# Patient Record
Sex: Male | Born: 1960 | Race: White | Hispanic: No | Marital: Married
Health system: Southern US, Community
[De-identification: ages and names within clinical notes are randomized; demographics above are authoritative.]

---

## 1999-01-25 ENCOUNTER — Ambulatory Visit (HOSPITAL_COMMUNITY): Admission: RE | Admit: 1999-01-25 | Discharge: 1999-01-25 | Payer: Self-pay | Admitting: Gastroenterology

## 2000-01-31 ENCOUNTER — Emergency Department (HOSPITAL_COMMUNITY): Admission: EM | Admit: 2000-01-31 | Discharge: 2000-01-31 | Payer: Self-pay | Admitting: Emergency Medicine

## 2001-06-30 ENCOUNTER — Encounter: Payer: Self-pay | Admitting: Gastroenterology

## 2001-06-30 ENCOUNTER — Encounter: Admission: RE | Admit: 2001-06-30 | Discharge: 2001-06-30 | Payer: Self-pay | Admitting: *Deleted

## 2001-10-14 ENCOUNTER — Ambulatory Visit (HOSPITAL_COMMUNITY): Admission: RE | Admit: 2001-10-14 | Discharge: 2001-10-14 | Payer: Self-pay | Admitting: Family Medicine

## 2001-10-14 ENCOUNTER — Encounter: Payer: Self-pay | Admitting: Family Medicine

## 2005-12-14 ENCOUNTER — Emergency Department (HOSPITAL_COMMUNITY): Admission: EM | Admit: 2005-12-14 | Discharge: 2005-12-14 | Payer: Self-pay | Admitting: Emergency Medicine

## 2005-12-15 ENCOUNTER — Inpatient Hospital Stay (HOSPITAL_COMMUNITY): Admission: EM | Admit: 2005-12-15 | Discharge: 2005-12-17 | Payer: Self-pay | Admitting: Emergency Medicine

## 2006-12-22 IMAGING — US US RENAL
1 series · 14 of 25 positions shown · non-contrast
Comparison: None.

CLINICAL DATA: 44-year-old.  Cystitis.  
 RENAL/URINARY TRACT ULTRASOUND:
TECHNIQUE: Complete ultrasound examination of the urinary tract was performed including evaluation of the kidneys, renal collecting systems, and urinary bladder.

[Series 1: renal · 0.43mm/px · 14 of 31 slices shown]
[im 1/31]
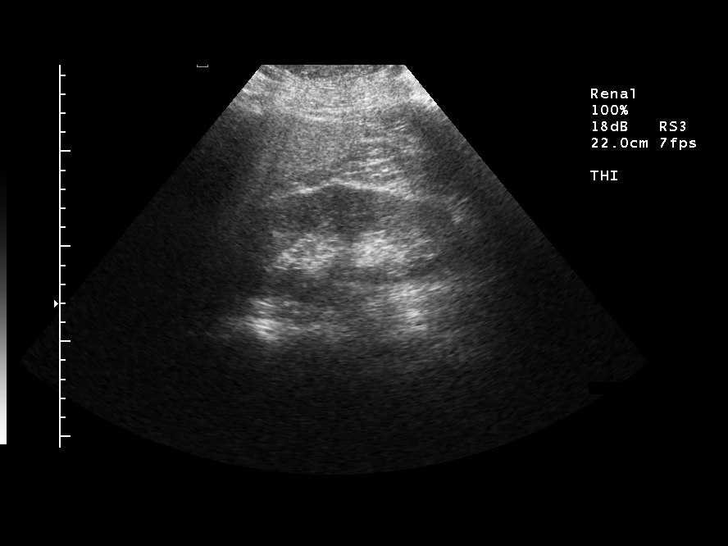
[im 3/31]
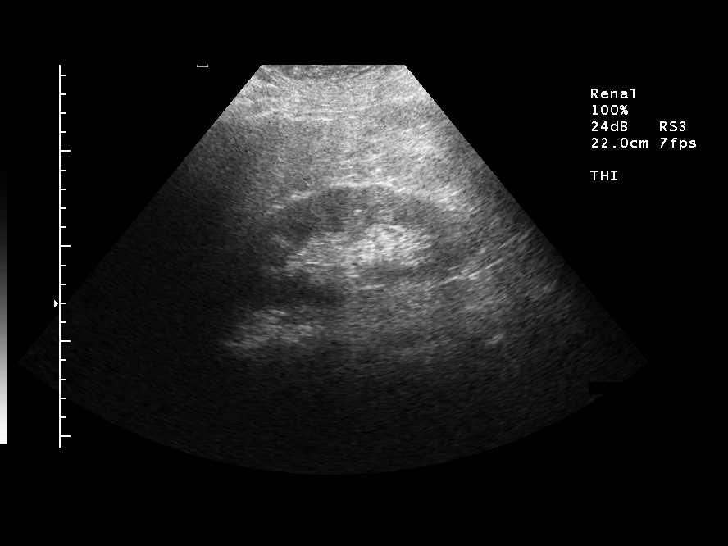
[im 6/31]
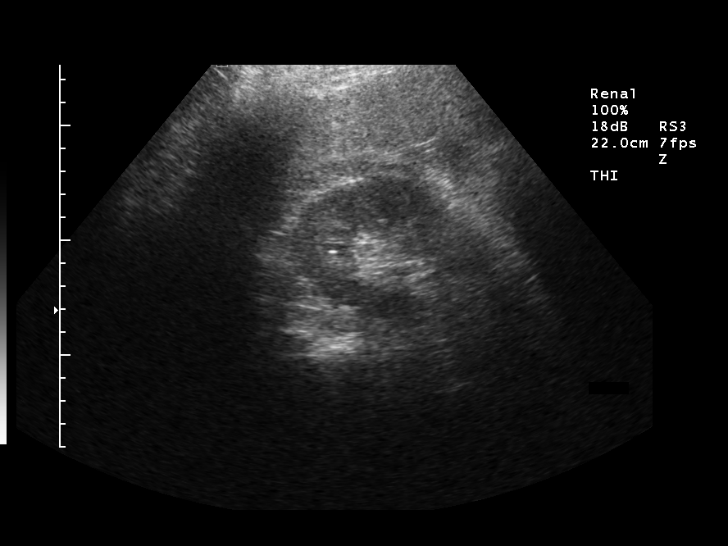
[im 8/31]
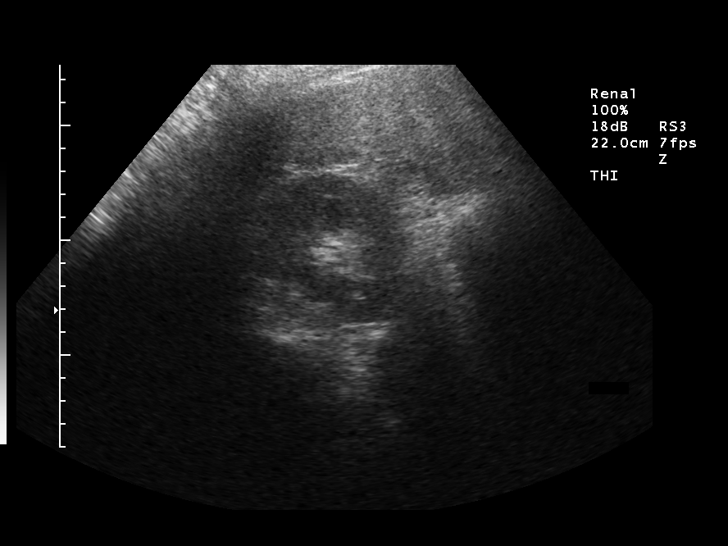
[im 11/31]
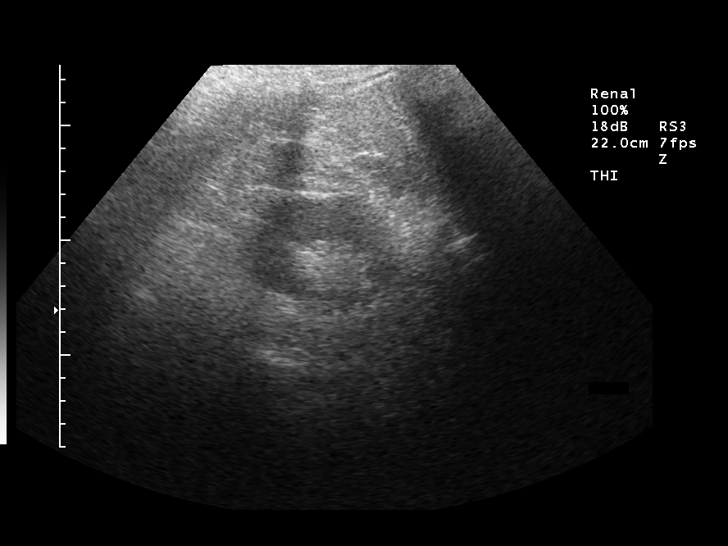
[im 12/31]
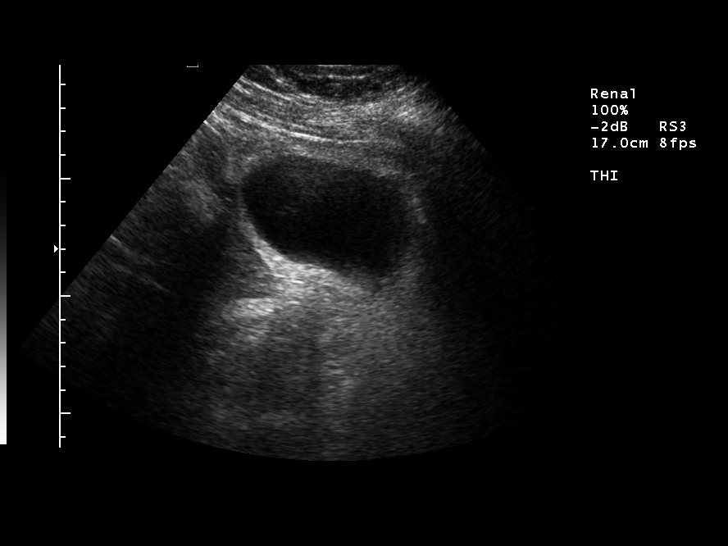
[im 14/31]
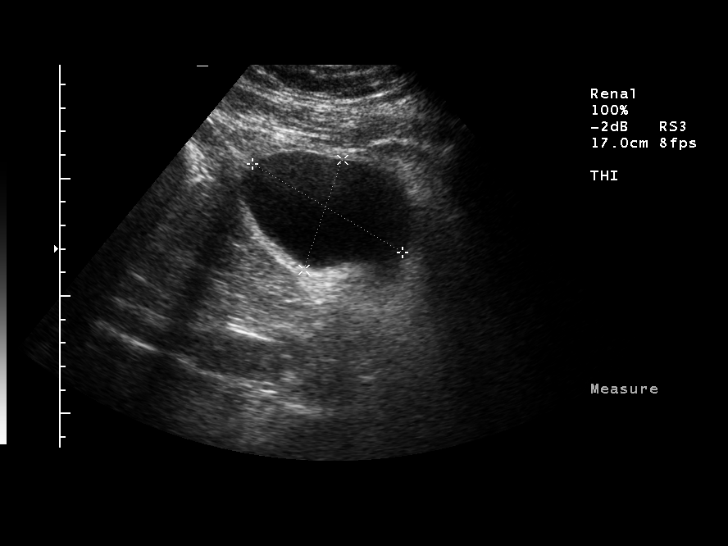
[im 17/31]
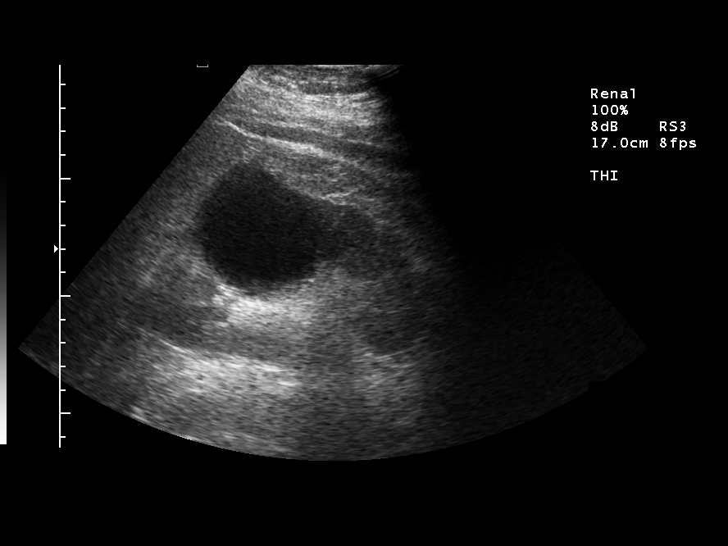
[im 19/31]
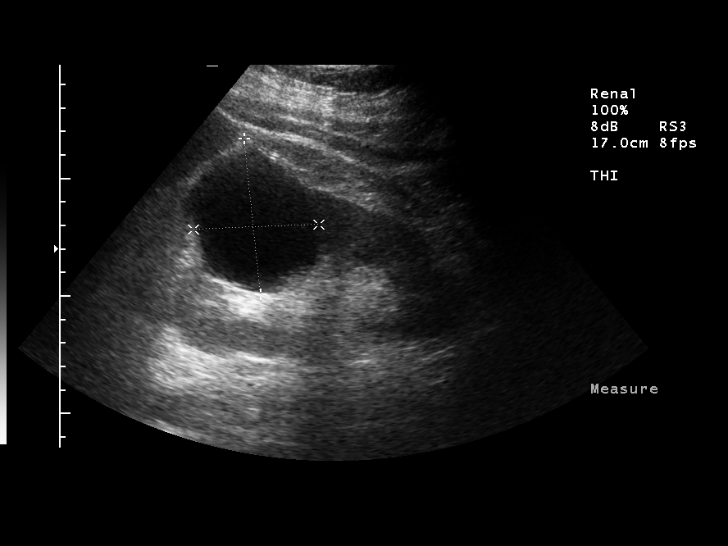
[im 21/31]
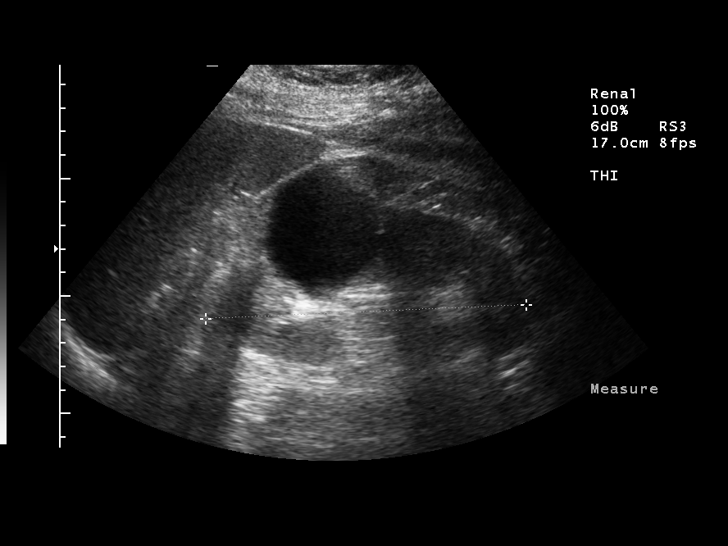
[im 23/31]
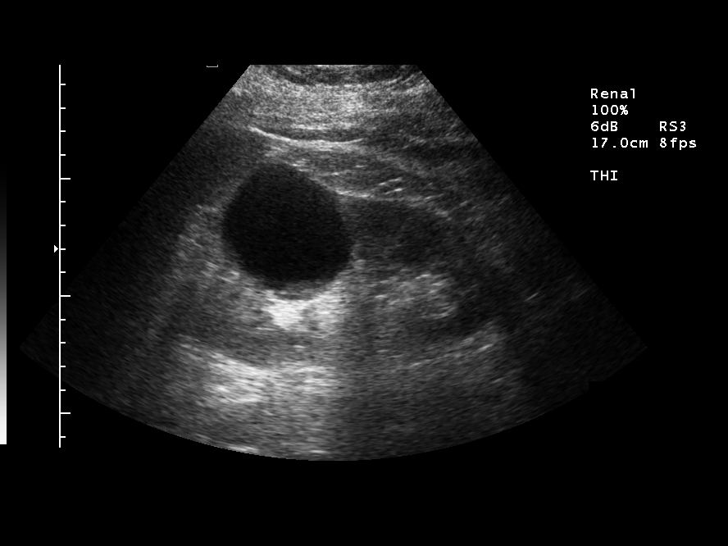
[im 26/31]
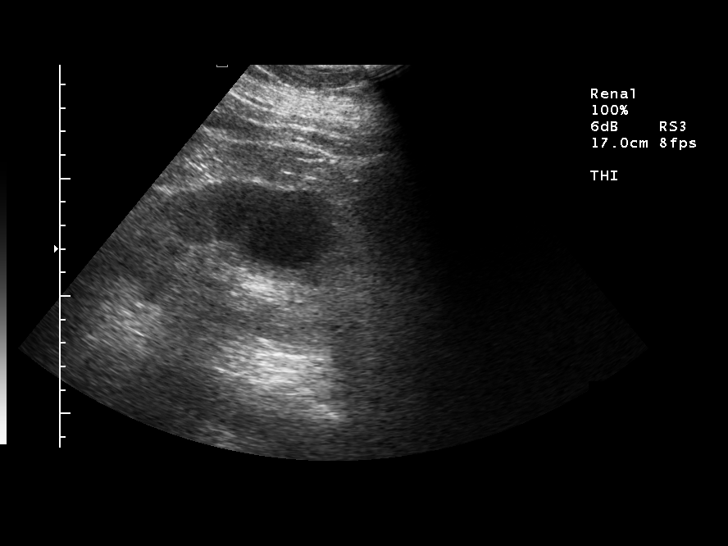
[im 28/31]
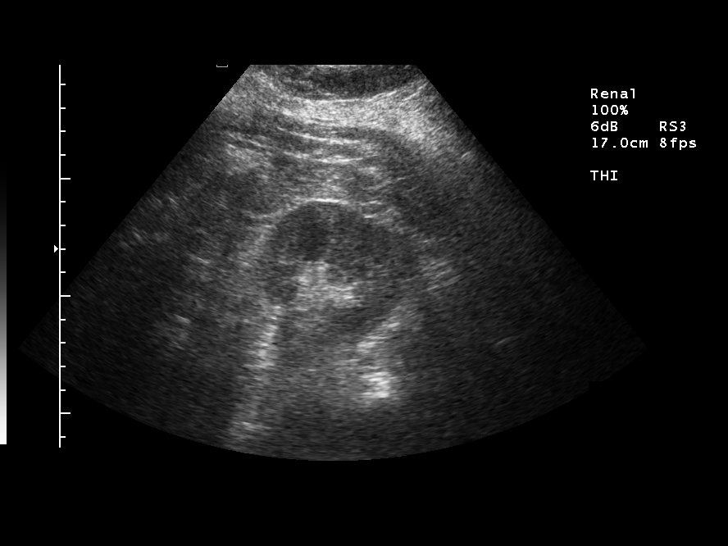
[im 31/31]
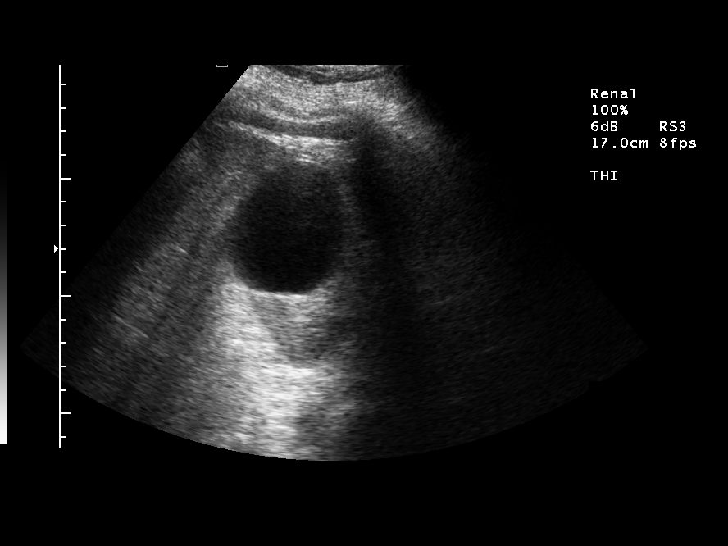

[14 of 25 positions shown; findings below may reference images not displayed]

FINDINGS: Right kidney measures 14.7 cm.  Left kidney measures 13.6 cm.  There is a 6.7 x 5.3 x 6.1 cm cyst associated with the upper pole region of the left kidney.  Renal parenchyma demonstrates normal echogenicity and no hydronephrosis is seen.  The bladder appears normal.
IMPRESSION: 1.  Simple appearing large left upper pole renal cyst. 
 2.  No hydronephrosis and no perinephric fluid collections.

## 2007-03-15 ENCOUNTER — Observation Stay (HOSPITAL_COMMUNITY): Admission: AD | Admit: 2007-03-15 | Discharge: 2007-03-16 | Payer: Self-pay | Admitting: Cardiology

## 2007-03-15 ENCOUNTER — Ambulatory Visit: Payer: Self-pay | Admitting: *Deleted

## 2007-03-17 ENCOUNTER — Ambulatory Visit: Payer: Self-pay

## 2007-03-19 ENCOUNTER — Ambulatory Visit: Payer: Self-pay | Admitting: Cardiology

## 2010-10-23 NOTE — Assessment & Plan Note (Signed)
[facility]                            CARDIOLOGY OFFICE NOTE   SAABIR, [name]                        MRN:          [id number]  DATE:03/19/2007                            DOB:          [birth year]    PRIMARY CARE PHYSICIAN:  Dr. Burnell Blanks.   REASON FOR PRESENTATION:  Evaluation of chest pain.   HISTORY OF PRESENT ILLNESS:  The patient was in the hospital for  observation over night a couple days ago with chest discomfort.  He had  no objective evidence of ischemia and ruled out for myocardial  infarction.  We sent him for a stress-perfusion study in our office on  the seventh.  This demonstrated an EF of 57% with no evidence of  ischemia or infarct.   Since that time the patient continues to have an occasional nagging,  gripping mid substernal chest discomfort.  It is moderate.  It is not  any worse than it was previously.  There are no associated symptoms. It  is not like his previous reflux.  He is not taking anything to get rid  of it.  He and his wife both discussed a lot of stress that he is under  and I wonder if this could be contributing.  He is active though he does  not exercise routinely.  He cannot really bring on any of the symptoms  unless he is doing something like changing a tire.   PAST MEDICAL HISTORY:  Hyperlipidemia, hypokalemia, low back pain,  degenerative joint disease, gastroesophageal reflux disease.   ALLERGIES:  None.   MEDICATIONS:  1. Allopurinol.  2. Naprosyn.  3. Prilosec 40 mg daily.   REVIEW OF SYSTEMS:  As stated in the HPI and otherwise negative for  other systems.   PHYSICAL EXAMINATION:  GENERAL:  The patient is in no distress.  VITAL SIGNS:  Blood pressure 122/90, heart rate 81 and regular.  Weight  274 pounds, body mass index 37.  NECK:  No jugular venous distension at 45 degrees, carotid upstroke  brisk and symmetric, no bruits, thyromegaly.  LYMPHATICS:  No adenopathy.  LUNGS:  Clear to  auscultation bilaterally.  BACK:  No costovertebral angle tenderness.  CHEST:  Unremarkable.  HEART:  PMI not displaced or sustained, S1 and S2 within normal limits,  no S3, no S4, clicks, rubs, no murmurs.  ABDOMEN:  Obese, positive bowel sounds, normal in frequency and pitch,  no bruits, no rebound, no guarding, no midline pulsation, no  hepatosplenomegaly.  SKIN:  No rashes, no nodules.  EXTREMITIES:  Pulses are 2+, no edema.   ASSESSMENT AND PLAN:  1. The patient's chest discomfort is non anginal.  No futher      cardiovascular workup is planned. We discussed primary risk      reduction.  2. Obesity.  We discussed the need to lose weight with diet and      exercise.  3. Followup - he will call Dr. Nathanial Rancher and schedule followup to      further evaluate this chest discomfort.  He can come back to see me  as needed.     Rollene Rotunda, MD, [facility]  Electronically Signed    JH/MedQ  DD: 03/19/2007  DT: 03/19/2007  Job #: [id number]   cc:   Burnell Blanks, MD

## 2010-10-23 NOTE — Discharge Summary (Signed)
[name], [name]                 ACCOUNT NO.:  [id number]   MEDICAL RECORD NO.:  [id number]          PATIENT TYPE:  INP   LOCATION:  2921                         FACILITY:  [facility]   PHYSICIAN:  Rollene Rotunda, MD, FACCDATE OF BIRTH:  05-13-1961   DATE OF ADMISSION:  03/15/2007  DATE OF DISCHARGE:  03/16/2007                         DISCHARGE SUMMARY - REFERRING   DISCHARGE DIAGNOSES:  1. Prolonged atypical chest discomfort, with transfer from [facility].  2. Hyperlipidemia.  3. Obesity.  4. Hypokalemia.  5. History as noted below.   SUMMARY OF HISTORY:  [name] is a 50 year old white male who was  transferred from [facility] for evaluation of a 26-month history of  waxing and waning intermittent chest discomfort which he describes as a  dull feeling, unassociated with shortness of breath, nausea, vomiting,  or palpitations.  On the evening of presentation, it was associated with  diaphoresis, and he presented secondary to the recurring nature of the  discomfort.  Symptoms improved at [facility] with morphine and sublingual  nitroglycerin.   PAST MEDICAL HISTORY:  1. Low back/  2. DJD.  3. Seasonal allergies.  4. GERD.  5. Obesity.   LABORATORY DATA:  At [facility]. admission H&H was 14.1 and 41.9,  normal indices, platelets 324, WBC 9.6.  Sodium 141, potassium 3.4, BUN  19, creatinine 0.9, normal LFTs.  CKs, MBs, relative indexes, and  troponins were within normal limits x2.  Fasting lipids showed total  cholesterol 198, triglycerides 156, HDL 30, LDL 137.  EKGs at [facility]  showed normal sinus rhythm, normal axis, early R-wave, nonspecific ST-T  wave changes.   HOSPITAL COURSE:  The patient was accepted in transfer and placed on IV  heparin as well as a beta blocker and aspirin.  Overnight, he did not  have any further chest discomfort.  Enzymes and EKGs were negative for  myocardial infarction.  He did not receive potassium supplementation,  with his  potassium at 3.4 yesterday.  Thus, he received some on the  morning of March 16, 2007.  Dr. Antoine Poche, after review, spoke with and  examined the patient and felt that he would benefit from an outpatient  stress Myoview.  Post this arrangement and after discussion, the patient  was agreeable with the plan, and the patient was discharged home.   DISPOSITION:  The patient is discharged home.  He received new  medications.  Prescriptions for Toprol XL 25 mg daily, and nitroglycerin  0.4 as needed.  He was asked to begin aspirin 81 mg every day, Claritin-  D daily, omeprazole 40 mg daily, and Naprosyn as needed sparingly.  He  will have a stress Myoview at Dr. Jenene Slicker office on March 17, 2007  at 12:30.  He was advised he may eat breakfast before 8:00 a.m..  He was  not to have any caffeine or decaffeinated products.  Also advised not to  take his Toprol-XL on the morning of March 17, 2007.  After breakfast,  he was advised nothing to eat or drink.  He may take medications except  the Toprol in the morning.  He will follow up with Dr. Antoine Poche in the  office on March 19, 2007 at 12:15 p.m.Marland Kitchen  At that time, review will be  given  to the Card[facility] and determine if it is necessary that he be placed on  a statin for his hyperlipidemia and permission to possibly return to  work.   DISCHARGE TIME:  25 minutes.      Joellyn Rued, PA-C      Rollene Rotunda, MD, [facility]  Electronically Signed    EW/MEDQ  D:  03/16/2007  T:  03/16/2007  Job:  [id number]   cc:   Burnell Blanks, MD

## 2010-10-23 NOTE — H&P (Signed)
NAMEEUSTACIO, [name]                 ACCOUNT NO.:  [id number]   MEDICAL RECORD NO.:  [id number]          PATIENT TYPE:  INP   LOCATION:  2921                         FACILITY:  [facility]   PHYSICIAN:  Audery Amel, MD    DATE OF BIRTH:  06/24/1960   DATE OF ADMISSION:  03/15/2007  DATE OF DISCHARGE:                              HISTORY & PHYSICAL   PRIMARY CARE PHYSICIAN:  Dr. Sharman Crate.   CHIEF COMPLAINT:  Chest pain.   HISTORY OF PRESENT ILLNESS:  The patient is a 50 year old white male  with no significant past medical history who was transferred from [facility] emergency department for further evaluation of chest pain.  The  patient states that he has been experiencing chest discomfort for the  last month.  He characterizes a dull discomfort in the right substernal  region which does not radiate.  In addition, there has been some  associated pain in the right posterior neck.  He denies any association  with shortness of breath, dyspnea on exertion, nausea, vomiting, or  palpitations.  In addition he denies PND, and orthopnea.  There is no  exertional component; however, he does note that his symptoms are  sometimes worse with movement in general. He denies any alleviating or  precipitating factors.  He states that his symptoms became worse this  past Friday evening prompting a visit to the emergency department  earlier today.  On arrival, an EKG was obtained which revealed normal  sinus rhythm and no evidence of acute injury or ischemia.  His initial  cardiac biomarkers were negative x1. He was transferred for further  evaluation and management.  On arrival, the patient is chest pain free  and hemodynamically stable.  Otherwise he is without complaints.   CARDIAC RISK FACTORS:  None.   PAST MEDICAL HISTORY:  1. Lower back DJD.  2. GERD.  3. Seasonal allergies.   CURRENT MEDICATIONS:  1. Claritin D 24 hour daily.  2. Omeprazole 40 mg daily.  3. Naprosyn p.r.n.   ALLERGIES:   No known drug allergies.   FAMILY HISTORY:  The patient's mother is alive and well without major  medical problems.  His father has atrial fibrillation and hypertension.  There is a general history of coronary artery disease as well as  cerebrovascular disease.  There is no history of early coronary disease  or sudden cardiac death.   SOCIAL HISTORY:  The patient is married.  He is a Naval architect by  profession.  He denies any tobacco, alcohol or illicit substance.  He  lives in [place].   REVIEW OF SYSTEMS:  As detailed in the HPI, otherwise complete review of  systems was obtained and is negative.   PHYSICAL EXAMINATION:  VITAL SIGNS:  Blood pressure 125/77, heart rate  is 82, O2 sats are 100% on room air.  GENERAL:  The patient is alert and oriented x3 in no acute stress and  pleasantly conversant.  HEENT:  Normocephalic, atraumatic, EOMI, PERRL, nares patent, ONP is  clear without erythema or exudate.  NECK:  Supple, full range  of motion, no JVD.  There is no palpable  thyromegaly or lymphadenopathy.  His carotid upstrokes are equal and  symmetric bilaterally with no audible bruits.  CHEST:  His chest is clear to auscultation bilaterally.  There is no  chest wall deformity or tenderness.  His PMI is nondisplaced in the left  mid clavicular line.  ABDOMEN:  His abdomen is soft, nontender, nondistended.  Positive bowel  sounds.  No hepatosplenomegaly.  EXTREMITIES:  Reveal no clubbing, cyanosis, or edema.  Peripheral pulses  are 2+ and symmetric bilaterally.  There is no evidence of inflammation  or ulceration.  NEUROLOGIC:  Grossly nonfocal.  PSYCHIATRIC:  Appropriate insight and judgment.   DATA:  EKG normal sinus rhythm with no evidence of acute injury or  ischemia.  There is borderline LVH.   Sodium 142, potassium 3.7, chloride 105, CO2 is 26, BUN is 19,  creatinine 0.9, CK 140, CK-MB 1.4, troponin I 0.03.  White blood cell  count 7.5, hematocrit 43, platelet count  340.   IMPRESSION:  1. Chest pain rule out myocardial infarction.  2. Gastroesophageal reflux disease.  3. Seasonal allergies.  4. Lower back degenerative joint disease.   PLAN:  We will admit the patient to a step-down bed for further  evaluation and management.  We will rule out myocardial infarction with  serial cardiac biomarkers and EKG. The patient was transferred on an  unfractionated heparin drip and we will continue with this mode of  therapy per pharmacy protocol.  Will continue aspirin 325 mg p.o. daily,  there is no indication for her to be 2B3A inhibitor or Plavix at this  point.  The patient does note that he has had borderline hypertension in  the past, however currently is taking no medications.  We will initiate  metoprolol 12.5 mg p.o. q.6 h.  Will check a fasting lipid profile in  the morning and consider initiation of statin therapy.  The nature of  his symptoms are somewhat unusual for cardiac origin and given the time  frame and duration I believe ACS is very unlikely. The patient will need  additional risk stratification either via a noninvasive approach or  cardiac catheterization.      Audery Amel, MD  Electronically Signed     SHG/MEDQ  D:  03/15/2007  T:  03/16/2007  Job:  [id number]

## 2010-10-26 NOTE — Consult Note (Signed)
NAMEEIDAN, MUELLNER                 ACCOUNT NO.:  [id number]   MEDICAL RECORD NO.:  [id number]          PATIENT TYPE:  INP   LOCATION:  1607                         FACILITY:  [facility]   PHYSICIAN:  Bertram Millard. Dahlstedt, M.D.DATE OF BIRTH:  [birth year]   DATE OF CONSULTATION:  12/17/2005  DATE OF DISCHARGE:  12/17/2005                                   CONSULTATION   REASON FOR CONSULTATION:  Prostatitis.   BRIEF HISTORY:  This 50 year old male was admitted a couple of days ago for  acute prostatitis.  Last week, he developed some myalgias and eventually  developed fever.  He presented to the emergency room where he was found to  have urinary tract infection and sent home on Cipro.  He presented back just  a couple of hours later with worsened symptoms as well as nausea.  Due to  high fever, he was admitted to the [facility].  He has been  on Cipro.  At the time of this dictation, he was growing an E. coli but  sensitivities were not back.  He defervesced on Rocephin.  Two days after  admission, at the time of consultation, he is feeling much better.  Dr.  Derenda Mis asked me to see him for further management.   He denies any prior history of urinary tract infections.  Currently, he is  voiding well and does not have dysuria.  He had some dysuria and frequency  as well as urgency less than 24 hours ago.   His medical history is, otherwise, essentially noncontributory.   An exam was not performed today.   I spent 15 minutes with Mr. Moravek and his wife, a nurse in the intensive care  unit.  We talked about prostatitis.  I feel like his clinical course is  improving, and that Cipro p.o. as an outpatient, in a community acquired  prostatitis, should be adequate.  Sensitivities are pending and we will  contact him with the results of this urine culture.  I sent him home with a  full two weeks of Cipro 500 mg b.i.d.  I will have him come to see me within the next couple of  weeks for follow-  up.  If he has questions before then, he will contact me.      Bertram Millard. Dahlstedt, M.D.  Electronically Signed     SMD/MEDQ  D:  12/17/2005  T:  12/17/2005  Job:  [id number]

## 2010-10-26 NOTE — Discharge Summary (Signed)
NAMEJSHAUN, [name]                 ACCOUNT NO.:  [id number]   MEDICAL RECORD NO.:  [id number]          PATIENT TYPE:  INP   LOCATION:  1607                         [facility]ACILITY:  [facility]   PHYSICIAN:  Melissa L. Ladona Ridgel, MD  DATE OF BIRTH:  19-Aug-1960   DATE OF ADMISSION:  12/14/2005  DATE OF DISCHARGE:  12/17/2005                                 DISCHARGE SUMMARY   CHIEF COMPLAINT:  Abdominal pain, nausea, vomiting, diarrhea, and chills.   DISCHARGING DIAGNOSES:  1.  Prostatitis.  The patient was admitted to the general medical floor and      treated with intravenous Cipro initially.  Urine cultures grew 80,000      colonies of Escherichia coli.  Sensitivities are pending.  Please note      that the patient's antibiotics were switched to ceftriaxone on day 2      secondary to continued chills and fever spikes.  On the day prior to      discharge, the patient had defervesced and had no further fever spiking.      He had mainly symptomatology of urgency and some burning.  The patient      was seen prior to discharge by Dr. Retta Diones of urology and set up for      outpatient management.  He will resume Cipro orally and will follow up      on the sensitivities.  2.  Gout.  The patient was maintained on his allopurinol, with no gouty      flare.  3.  Reflux.  The patient was treated with proton pump inhibitor and will      resume his Prilosec 40 mg once daily.   MEDICATIONS AT THE TIME OF DISCHARGE:  1.  Allopurinol 100 mg once daily.  2.  Prilosec 40 mg once daily.  3.  Cipro 500 mg twice daily x2 weeks, with followup to Dr. Retta Diones as an      outpatient.   HISTORY OF PRESENT ILLNESS:  The patient is a very pleasant 50 year old  white male with a past medical history for gout and reflux.  The patient  presented with chills, fever, and was seen in the emergency room and given  antibiotics and pain medication for what appeared to be a urinary tract  infection.  The patient left the  hospital and on his way home developed  profuse abdominal pain, nausea, vomiting, and an episode of diarrhea.  The  patient was brought back to the emergency room and was admitted.  He was  started on IV antibiotic therapy and proton pump inhibitor.  He had no  further loose stools.  A stool culture was, however, sent for Clostridium  difficile and enteric pathogens.  Nothing has grown out.   On the day of discharge, the patient's T-max was 98.6.  He had no chills  overnight.  He as seen and evaluated by Dr. Retta Diones, and the plan is to  discharge to home on Cipro early and follow up with his cultures.  The  patient will follow up with Dr. Retta Diones in the outpatient  setting for  further care.   PERTINENT LABORATORY VALUES:  As stated, urine cultures growing Escherichia  coli, 80,000.  His white count has normalized at 10.4, which is down from a  peak of 15.2.  His hemoglobin is stable at 13.8.  Platelets are 298.  His  sodium is 142, potassium 3.6, chloride 108, CO2 27, BUN 7, creatinine 0.8.   Please note that the patient had an ultrasound of his kidneys which showed a  right renal cyst approximately 9 cm.  It is a lone cyst and could be  followed up as an outpatient with urinalysis and repeat ultrasound at the  discretion of his primary care physician.   The patient is stable.   DISPOSITION:  To home, with followup to Dr. Retta Diones.      Melissa L. Ladona Ridgel, MD  Electronically Signed     MLT/MEDQ  D:  12/17/2005  T:  12/17/2005  Job:  [id number]   cc:   Bertram Millard. Dahlstedt, M.D.  Fax: [phone]

## 2010-10-26 NOTE — H&P (Signed)
[name], [name]                 ACCOUNT NO.:  [id number]   MEDICAL RECORD NO.:  [id number]          PATIENT TYPE:  INP   LOCATION:  1607                         FACILITY:  [facility]   PHYSICIAN:  Kela Millin, M.D.DATE OF BIRTH:  September 21, 1960   DATE OF ADMISSION:  12/14/2005  DATE OF DISCHARGE:                                HISTORY & PHYSICAL   CHIEF COMPLAINT:  Nausea, diarrhea, and chills.   HISTORY OF PRESENT ILLNESS:  The patient is a 50 year old white male with a  past medical history significant for GERD, gout/arthritis who presents with  above complaints times two days.  He states that he was in his usual state  of health until about two days ago when he began having diarrhea, six to  seven watery stools today.  He denies hematochezia and no melena.  He also  developed fevers and chills and so he decided to come to the ER.  He admits  to dysuria, as well as nausea.  He was seen earlier today in the ER,  diagnosed with a UTI and discharged home on oral antibiotics.  On their way  home, the patient became very nauseous and felt weaker.  He also reported  that he was having some back pain and so he came back to the ER.   In the ER, the patient's white cell count was noted to be elevated at 15.2  and his urinalysis was positive for nitrites and small leukocyte esterase  but only with 0 to 2 WBC, many bacteria noted.  He is admitted to the [facility]  hospitalist service for further evaluation and management.   The patient admits to eating some baked chicken for 4th of July but denies  eating any hamburgers or eating out and no recent travel.   PAST MEDICAL HISTORY:  1.  As stated above.  2.  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   MEDICATIONS:  1.  Allopurinol.  He does not know the dose.  2.  Prilosec 40 mg daily.  3.  Naprosyn 250 mg daily.   ALLERGIES:  No known drug allergies.   SOCIAL HISTORY:  He denies tobacco.  Also denies alcohol.   FAMILY HISTORY:  His  mother has diabetes and hypertension.  His dad has  hypertension.  He has an aunt with a stroke and another aunt with leukemia.   REVIEW OF SYSTEMS:  As per HPI.  Other review of systems negative.   PHYSICAL EXAMINATION:  GENERAL:  He is a middle-aged white male, alert and  oriented, in no apparent distress.  VITAL SIGNS:  Temperature 98.8, blood pressure 103/61, pulse 62, respiratory  rate 20, O2 sat 97%.  HEENT:  PERRL.  EOMI.  Sclerae anicteric.  Slightly dry mucous membranes.  No oral exudates.  NECK:  Supple.  No adenopathy and no thyromegaly.  LUNGS:  Clear to auscultation bilaterally.  No crackles or wheezes.  CARDIOVASCULAR:  Regular rate and rhythm.  Normal S1 and S2.  ABDOMEN:  Obese, bowel sounds present, nontender, nondistended.  No  organomegaly and no masses palpable.  BACK:  No CVA tenderness.  EXTREMITIES:  No cyanosis and no edema.  NEURO:  Alert and oriented x 3.  Cranial nerves II through XII were grossly  intact.  Nonfocal exam.   LABORATORY DATA:  Urine is clear.  Urine nitrite positive.  Leukocyte  esterase is small.  Urine WBC 0 to 2.  White cell count is 15.2.  Hemoglobin  15.1, hematocrit 45.1, platelet count 300.  Neutrophil count 87%.  Sodium  140, potassium 4.6, chloride 105, CO2 27, glucose 91, BUN 11, creatinine  0.9.  Calcium 9.4.   ASSESSMENT AND PLAN:  1.  Diarrhea.  Probable gastroenteritis, bacterial versus viral.  Will      obtain stool studies, supportive care.  Follow and treat as appropriate.      Pending stool studies.  2.  Probable urinary tract infection.  As discussed above, as noted, no      pyuria.  Will obtain urine cultures, empiric antibiotics now and follow.  3.  Gastroesophageal reflux disease.  PPI.  4.  History of Arthitis/gout.  Continue allopurinol.      Kela Millin, M.D.  Electronically Signed     ACV/MEDQ  D:  12/15/2005  T:  12/15/2005  Job:  [id number]

## 2011-03-21 LAB — CARDIAC PANEL(CRET KIN+CKTOT+MB+TROPI)
CK, MB: 2
Relative Index: 1.7
Relative Index: INVALID
Total CK: 118
Troponin I: 0.01
Troponin I: 0.01

## 2011-03-21 LAB — CBC
HCT: 41.9
HCT: 41.9
Hemoglobin: 14.1
MCHC: 33.8
MCHC: 33.8
MCV: 89.7
MCV: 89.8
Platelets: 314
Platelets: 324
RBC: 4.66
RDW: 13.5
RDW: 13.6
WBC: 7.4
WBC: 9.6

## 2011-03-21 LAB — COMPREHENSIVE METABOLIC PANEL
ALT: 53
AST: 31
Albumin: 3.6
Alkaline Phosphatase: 50
BUN: 19
CO2: 27
Calcium: 8.8
Chloride: 107
Creatinine, Ser: 0.9
GFR calc Af Amer: >60
GFR calc non Af Amer: >60
Glucose, Bld: 117 — ABNORMAL HIGH
Potassium: 3.4 — ABNORMAL LOW
Sodium: 141
Total Bilirubin: 1.1
Total Protein: 6

## 2011-03-21 LAB — LIPID PANEL
HDL: 30 — ABNORMAL LOW
Total CHOL/HDL Ratio: 6.6
Triglycerides: 156 — ABNORMAL HIGH
VLDL: 31

## 2011-03-21 LAB — HEPARIN LEVEL (UNFRACTIONATED)
Heparin Unfractionated: 0.34
Heparin Unfractionated: 0.64

## 2017-10-09 ENCOUNTER — Other Ambulatory Visit: Payer: Self-pay

## 2017-10-09 ENCOUNTER — Encounter (HOSPITAL_COMMUNITY): Payer: Self-pay

## 2017-10-09 ENCOUNTER — Emergency Department (HOSPITAL_COMMUNITY)
Admission: EM | Admit: 2017-10-09 | Discharge: 2017-10-09 | Disposition: A | Discharge status: Home / Self Care | Payer: BLUE CROSS/BLUE SHIELD | Attending: Emergency Medicine | Admitting: Emergency Medicine

## 2017-10-09 DIAGNOSIS — R5383 Other fatigue: Secondary | ICD-10-CM | POA: Diagnosis present

## 2017-10-09 DIAGNOSIS — Z79899 Other long term (current) drug therapy: Secondary | ICD-10-CM | POA: Diagnosis not present

## 2017-10-09 DIAGNOSIS — Z7984 Long term (current) use of oral hypoglycemic drugs: Secondary | ICD-10-CM | POA: Diagnosis not present

## 2017-10-09 DIAGNOSIS — E1165 Type 2 diabetes mellitus with hyperglycemia: Secondary | ICD-10-CM | POA: Insufficient documentation

## 2017-10-09 DIAGNOSIS — R739 Hyperglycemia, unspecified: Secondary | ICD-10-CM

## 2017-10-09 LAB — COMPREHENSIVE METABOLIC PANEL
ALT: 42 U/L (ref 17–63)
AST: 27 U/L (ref 15–41)
Albumin: 4 g/dL (ref 3.5–5.0)
Alkaline Phosphatase: 54 U/L (ref 38–126)
Anion gap: 11 (ref 5–15)
BUN: 24 mg/dL — AB (ref 6–20)
CHLORIDE: 101 mmol/L (ref 101–111)
CO2: 21 mmol/L — AB (ref 22–32)
CREATININE: 1.37 mg/dL — AB (ref 0.61–1.24)
Calcium: 9.5 mg/dL (ref 8.9–10.3)
GFR calc Af Amer: >60 mL/min (ref >60)
GFR calc non Af Amer: 56 mL/min — ABNORMAL LOW (ref >60)
Glucose, Bld: 422 mg/dL — ABNORMAL HIGH (ref 65–99)
POTASSIUM: 4.4 mmol/L (ref 3.5–5.1)
SODIUM: 133 mmol/L — AB (ref 135–145)
Total Bilirubin: 0.8 mg/dL (ref 0.3–1.2)
Total Protein: 7.2 g/dL (ref 6.5–8.1)

## 2017-10-09 LAB — URINALYSIS, ROUTINE W REFLEX MICROSCOPIC
BACTERIA UA: NONE SEEN
Bilirubin Urine: NEGATIVE
Glucose, UA: >=500 mg/dL — AB
Hgb urine dipstick: NEGATIVE
KETONES UR: 20 mg/dL — AB
Leukocytes, UA: NEGATIVE
Nitrite: NEGATIVE
PROTEIN: NEGATIVE mg/dL
Specific Gravity, Urine: 1.032 — ABNORMAL HIGH (ref 1.005–1.030)
pH: 5 (ref 5.0–8.0)

## 2017-10-09 LAB — I-STAT CHEM 8, ED
BUN: 26 mg/dL — ABNORMAL HIGH (ref 6–20)
CREATININE: 1.3 mg/dL — AB (ref 0.61–1.24)
Calcium, Ion: 1.1 mmol/L — ABNORMAL LOW (ref 1.15–1.40)
Chloride: 100 mmol/L — ABNORMAL LOW (ref 101–111)
Glucose, Bld: 415 mg/dL — ABNORMAL HIGH (ref 65–99)
HEMATOCRIT: 46 % (ref 39.0–52.0)
Hemoglobin: 15.6 g/dL (ref 13.0–17.0)
POTASSIUM: 4.3 mmol/L (ref 3.5–5.1)
SODIUM: 134 mmol/L — AB (ref 135–145)
TCO2: 23 mmol/L (ref 22–32)

## 2017-10-09 LAB — BLOOD GAS, VENOUS
Acid-base deficit: 1.2 mmol/L (ref 0.0–2.0)
Bicarbonate: 23.2 mmol/L (ref 20.0–28.0)
FIO2: 21
O2 Saturation: 81.9 %
PATIENT TEMPERATURE: 98.6
PH VEN: 7.383 (ref 7.250–7.430)
PO2 VEN: 48.6 mmHg — AB (ref 32.0–45.0)
pCO2, Ven: 39.9 mmHg — ABNORMAL LOW (ref 44.0–60.0)

## 2017-10-09 LAB — CBC
HCT: 44.4 % (ref 39.0–52.0)
HEMOGLOBIN: 15.3 g/dL (ref 13.0–17.0)
MCH: 30.1 pg (ref 26.0–34.0)
MCHC: 34.5 g/dL (ref 30.0–36.0)
MCV: 87.4 fL (ref 78.0–100.0)
Platelets: 364 10*3/uL (ref 150–400)
RBC: 5.08 MIL/uL (ref 4.22–5.81)
RDW: 13.2 % (ref 11.5–15.5)
WBC: 13.9 10*3/uL — ABNORMAL HIGH (ref 4.0–10.5)

## 2017-10-09 LAB — CBG MONITORING, ED
GLUCOSE-CAPILLARY: 347 mg/dL — AB (ref 65–99)
Glucose-Capillary: 383 mg/dL — ABNORMAL HIGH (ref 65–99)

## 2017-10-09 MED ORDER — SODIUM CHLORIDE 0.9 % IV BOLUS
1000.0000 mL | Freq: Once | INTRAVENOUS | Status: AC
Start: 2017-10-09 — End: 2017-10-09
  Administered 2017-10-09: 1000 mL via INTRAVENOUS

## 2017-10-09 MED ORDER — GLIPIZIDE 5 MG PO TABS: 2.5000 mg | ORAL_TABLET | Freq: Two times a day (BID) | ORAL | 0 refills | Status: AC

## 2017-10-09 NOTE — ED Provider Notes (Signed)
[facility]-EMERGENCY DEPT Provider Note   CSN: [id number] Arrival date & time: 10/09/17  1647     History   Chief Complaint Chief Complaint  Patient presents with  . Hyperglycemia    HPI [name] is a 57 y.o. male.  HPI   Last Monday began to feel some fatigue, then by Thursday or Friday felt exhausted, fatigued, blurred vision. Sugar went to 450 and 500, told to go to hospital on Saturday, began to drink a lot of water, then Monday went to see Dr and increased medication. Metformin  BID, glipizide.  Sent out labs. Serum ketones back, were increased and sent to ED.  380s=400s all weekend and has had continued hyperglycemia.  Has only had DM for 6 mos, metformin was working initially.   Didn't missed any doses, denies loading up on sugar, didn't start eating carbs.  No fevers, no cough, no infectious symptoms. Started lipitor 2 weeks ago, then stopped it.   Nausea, thirst, no vomiting.   History reviewed. No pertinent past medical history.  There are no active problems to display for this patient.   ** The histories are not reviewed yet. Please review them in the "History" navigator section and refresh this SmartLink.      Home Medications    Prior to Admission medications   Medication Sig Start Date End Date Taking? Authorizing Provider  allopurinol (ZYLOPRIM) 300 MG tablet TAKE 1 TABLET BY MOUTH EVERY DAY FOR GOUT 09/05/17  Yes [provider]  B Complex-C (B-COMPLEX WITH VITAMIN C) tablet Take 1 tablet by mouth daily.   Yes [provider]  clonazePAM (KLONOPIN) 0.5 MG tablet TK 1 T PO  HS PRF SLEEP 08/13/17  Yes [provider]  cyclobenzaprine (FLEXERIL) 5 MG tablet Take 5 mg by mouth at bedtime.  08/08/17  Yes [provider]  glipiZIDE (GLUCOTROL XL) 5 MG 24 hr tablet Take 5 mg by mouth daily. 10/06/17  Yes [provider]  lisinopril (PRINIVIL,ZESTRIL) 10 MG tablet Take 10 mg by mouth daily.   09/05/17  Yes [provider]  metFORMIN (GLUCOPHAGE-XR) 500 MG 24 hr tablet Take 1,000 mg by mouth 2 (two) times daily.  09/17/17  Yes [provider]  Multiple Vitamins-Minerals (MULTIVITAMIN ADULT) TABS Take 1 tablet by mouth daily.   Yes [provider]  Omega-3 Fatty Acids (FISH OIL) 1000 MG CAPS Take 1 capsule by mouth daily.   Yes [provider]  omeprazole (PRILOSEC) 20 MG capsule TK ONE C PO  QD FOR GERD. 09/18/17  Yes [provider]  Venlafaxine HCl 75 MG TB24 TK 1 T PO D FOR DEPRESSION/ANXIETY 09/18/17  Yes [provider]  atorvastatin (LIPITOR) 10 MG tablet Take 10 mg by mouth daily.  09/25/17   [provider]    Family History History reviewed. No pertinent family history.  Social History Social History   Tobacco Use  . Smoking status: Not on file  Substance Use Topics  . Alcohol use: Not on file  . Drug use: Not on file     Allergies   Patient has no known allergies.   Review of Systems Review of Systems  Constitutional: Positive for fatigue. Negative for fever.  HENT: Negative for sore throat.   Eyes: Positive for visual disturbance.  Respiratory: Negative for shortness of breath.   Cardiovascular: Negative for chest pain.  Gastrointestinal: Positive for nausea. Negative for abdominal pain and vomiting.  Endocrine: Positive for polydipsia.  Genitourinary:  Negative for difficulty urinating and dysuria.  Musculoskeletal: Negative for back pain and neck stiffness.  Skin: Negative for rash.  Neurological: Positive for light-headedness and headaches (slight every once in a while). Negative for syncope, facial asymmetry, weakness and numbness.     Physical Exam Updated Vital Signs BP 124/70   Pulse 91   Temp 98 F (36.7 C) (Oral)   Resp 16   Ht  (1.778 m)   Wt (!) 140.2 kg (309 lb)   SpO2 96%   BMI 44.34 kg/m   Physical Exam  Constitutional: He is oriented to person, place, and time.  He appears well-developed and well-nourished. No distress.  HENT:  Head: Normocephalic and atraumatic.  Eyes: Conjunctivae and EOM are normal.  Neck: Normal range of motion.  Cardiovascular: Normal rate, regular rhythm, normal heart sounds and intact distal pulses. Exam reveals no gallop and no friction rub.  No murmur heard. Pulmonary/Chest: Effort normal and breath sounds normal. No respiratory distress. He has no wheezes. He has no rales.  Abdominal: Soft. He exhibits no distension. There is no tenderness. There is no guarding.  Musculoskeletal: He exhibits no edema.  Neurological: He is alert and oriented to person, place, and time.  Skin: Skin is warm and dry. He is not diaphoretic.  Nursing note and vitals reviewed.    ED Treatments / Results  Labs (all labs ordered are listed, but only abnormal results are displayed) Labs Reviewed  CBC - Abnormal; Notable for the following components:      Result Value   WBC 13.9 (*)    All other components within normal limits  URINALYSIS, ROUTINE W REFLEX MICROSCOPIC - Abnormal; Notable for the following components:   APPearance HAZY (*)    Specific Gravity, Urine 1.032 (*)    Glucose, UA >=500 (*)    Ketones, ur 20 (*)    All other components within normal limits  COMPREHENSIVE METABOLIC PANEL - Abnormal; Notable for the following components:   Sodium 133 (*)    CO2 21 (*)    Glucose, Bld 422 (*)    BUN 24 (*)    Creatinine, Ser 1.37 (*)    GFR calc non Af Amer 56 (*)    All other components within normal limits  BLOOD GAS, VENOUS - Abnormal; Notable for the following components:   pCO2, Ven 39.9 (*)    pO2, Ven 48.6 (*)    All other components within normal limits  CBG MONITORING, ED - Abnormal; Notable for the following components:   Glucose-Capillary 383 (*)    All other components within normal limits  I-STAT CHEM 8, ED - Abnormal; Notable for the following components:   Sodium 134 (*)    Chloride 100 (*)    BUN 26 (*)      Creatinine, Ser 1.30 (*)    Glucose, Bld 415 (*)    Calcium, Ion 1.10 (*)    All other components within normal limits  CBG MONITORING, ED  I-STAT VENOUS BLOOD GAS, ED    EKG None  Radiology No results found.  Procedures Procedures (including critical care time)  Medications Ordered in ED Medications  sodium chloride 0.9 % bolus 1,000 mL (1,000 mLs Intravenous New Bag/Given 10/09/17 1806)  sodium chloride 0.9 % bolus 1,000 mL (1,000 mLs Intravenous New Bag/Given 10/09/17 1807)     Initial Impression / Assessment and Plan / ED Course  I have reviewed the triage vital signs and the nursing notes.  Pertinent labs &  imaging results that were available during my care of the patient were reviewed by me and considered in my medical decision making (see chart for details).    57 year old male with a history of diabetes diagnosed 6 months ago, presents with concern for hyperglycemia, increased thirst, fatigue.  Patient had ketones checked 2 days ago in the outpatient office which were elevated, and was instructed to come to the emergency department for further care.  Today, patient does have small ketones in the urine of 20, however has no other signs of diabetic ketoacidosis on labs.  His anion gap is 11, pH venous is 7.383, bicarb 21.  He is hyperglycemic in the ED and was given IV fluids with a recheck of blood glucose found to be 347.  Unclear cause of glucose elevation, denies diet changes, no signs of infection by history or exam.  Recommend close follow up with PCP and diabetic specialist, discussion of initiation of insulin as outpatient.  Given difficulty with control after nearly 5 days of taking medication, will add 2.5mg  immediate acting glyburide to take BID with meals in addition to  XR.  Recommend discussion with his PCP tomorrow.   Final Clinical Impressions(s) / ED Diagnoses   Final diagnoses:  Hyperglycemia    ED Discharge Orders    None       Alvira Monday,  MD 10/10/17 [phone]

## 2017-10-09 NOTE — ED Triage Notes (Signed)
Pt reports that his ketone level was doubled at the doctor on Monday. They called him back today and told him to come to the ED. He has been drinking a lot of water at home. He is experiencing nausea, dizziness, lethargy and dry mouth. A&Ox4. Ambulatory.

## 2019-09-09 DEATH — deceased
